# Patient Record
Sex: Male | Born: 2005 | Hispanic: No | Marital: Single | Smoking: Never smoker
Health system: Southern US, Community
[De-identification: ages and names within clinical notes are randomized; demographics above are authoritative.]

## PROBLEM LIST (undated history)

## (undated) DIAGNOSIS — Q872 Congenital malformation syndromes predominantly involving limbs: Secondary | ICD-10-CM

## (undated) DIAGNOSIS — Q600 Renal agenesis, unilateral: Secondary | ICD-10-CM

## (undated) DIAGNOSIS — Q249 Congenital malformation of heart, unspecified: Secondary | ICD-10-CM

## (undated) DIAGNOSIS — IMO0002 Reserved for concepts with insufficient information to code with codable children: Secondary | ICD-10-CM

## (undated) DIAGNOSIS — K5900 Constipation, unspecified: Secondary | ICD-10-CM

## (undated) DIAGNOSIS — Q8789 Other specified congenital malformation syndromes, not elsewhere classified: Secondary | ICD-10-CM

## (undated) HISTORY — PX: COLON SURGERY: SHX602

## (undated) HISTORY — DX: Other specified congenital malformation syndromes, not elsewhere classified: Q87.89

## (undated) HISTORY — DX: Constipation, unspecified: K59.00

## (undated) HISTORY — DX: Congenital malformation syndromes predominantly involving limbs: Q87.2

## (undated) HISTORY — DX: Reserved for concepts with insufficient information to code with codable children: IMO0002

## (undated) HISTORY — DX: Renal agenesis, unilateral: Q60.0

## (undated) HISTORY — DX: Congenital malformation of heart, unspecified: Q24.9

---
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
## 2023-05-04 ENCOUNTER — Ambulatory Visit
Admission: RE | Admit: 2023-05-04 | Discharge: 2023-05-04 | Disposition: A | Discharge status: Home / Self Care | Payer: Medicaid Other | Source: Ambulatory Visit | Attending: Emergency Medicine | Admitting: Emergency Medicine

## 2023-05-04 ENCOUNTER — Ambulatory Visit: Payer: Self-pay

## 2023-05-04 VITALS — BP 127/86 | HR 106 | Temp 99.6°F | Resp 16 | Ht 71.0 in | Wt 300.0 lb

## 2023-05-04 DIAGNOSIS — J069 Acute upper respiratory infection, unspecified: Secondary | ICD-10-CM

## 2023-05-04 LAB — RESP PANEL BY RT-PCR (FLU A&B, COVID) ARPGX2
Influenza A by PCR: NEGATIVE
Influenza B by PCR: NEGATIVE
SARS Coronavirus 2 by RT PCR: NEGATIVE

## 2023-05-04 NOTE — ED Provider Notes (Signed)
[facility]    CSN: [id number] Arrival date & time: 05/04/23  1339      History   Chief Complaint Chief Complaint  Patient presents with   Cough    HPI Russell Vang is a 17 y.o. male.   17 year old male pt, Russell Vang, presents to urgent care for evaluation of cough x 1 day.  The history is provided by the patient. No language interpreter was used.    Past Medical History:  Diagnosis Date   Constipation    Solitary kidney    VACTERL association     Patient Active Problem List   Diagnosis Date Noted   Viral URI with cough 05/04/2023    Past Surgical History:  Procedure Laterality Date   COLON SURGERY         Home Medications    Prior to Admission medications   Medication Sig Start Date End Date Taking? Authorizing Provider  ibuprofen (ADVIL) 800 MG tablet Take 1 tablet (800 mg total) by mouth 3 (three) times daily. Take with food. 04/13/19   Bailey Mech, NP  oseltamivir (TAMIFLU) 75 MG capsule Take 1 capsule (75 mg total) by mouth every 12 (twelve) hours. 06/02/22   White, Elita Boone, NP  predniSONE (DELTASONE) 10 MG tablet 50 mg daily x 3 days, then 40 mg daily x 3 days, then 30 mg daily x 3 days, then 20 mg daily x 3 days, then 10 mg daily x 3 days. 12/16/17   Tommie Sams, DO  sodium chloride irrigation 0.9 % irrigation IRRIGATE WITH 1500 ML EVERY OTHER DAY 11/12/14   [provider]    Family History Family History  Problem Relation Age of Onset   Healthy Mother    Healthy Father     Social History Social History   Tobacco Use   Smoking status: Never   Smokeless tobacco: Never  Vaping Use   Vaping status: Never Used  Substance Use Topics   Alcohol use: No    Alcohol/week: 0.0 standard drinks of alcohol   Drug use: No     Allergies   Patient has no known allergies.   Review of Systems Review of Systems   Physical Exam Triage Vital Signs ED Triage Vitals [05/04/23 1345]  Encounter Vitals Group      BP      Systolic BP Percentile      Diastolic BP Percentile      Pulse      Resp 16     Temp      Temp Source Oral     SpO2      Weight      Height      Head Circumference      Peak Flow      Pain Score      Pain Loc      Pain Education      Exclude from Growth Chart    No data found.  Updated Vital Signs BP 127/86 (BP Location: Right Arm)   Pulse (!) 106   Temp 99.6 F (37.6 C) (Oral)   Resp 16   Ht 5\' 11"  (1.803 m)   Wt 300 lb (136.1 kg)   SpO2 96%   BMI 41.84 kg/m   Visual Acuity Right Eye Distance:   Left Eye Distance:   Bilateral Distance:    Right Eye Near:   Left Eye Near:    Bilateral Near:     Physical Exam   UC Treatments /  Results  Labs (all labs ordered are listed, but only abnormal results are displayed) Labs Reviewed  RESP PANEL BY RT-PCR (FLU A&B, COVID) ARPGX2    EKG   Radiology No results found.  Procedures Procedures (including critical care time)  Medications Ordered in UC Medications - No data to display  Initial Impression / Assessment and Plan / UC Course  I have reviewed the triage vital signs and the nursing notes.  Pertinent labs & imaging results that were available during my care of the patient were reviewed by me and considered in my medical decision making (see chart for details).    Discussed exam findings and plan of care with patient, strict go to ER precautions given.   Patient verbalized understanding to this provider. Ddx: Viral uri w cough Final Clinical Impressions(s) / UC Diagnoses   Final diagnoses:  Viral URI with cough     Discharge Instructions      Check my chart for results. Most likely you have a viral illness: no antibiotic as indicated at this time, May treat with OTC meds of choice. Make sure to drink plenty of fluids to stay hydrated(gatorade, water, popsicles,jello,etc), avoid caffeine products. Follow up with PCP. Return as needed.     ED Prescriptions   None    PDMP not  reviewed this encounter.   Clancy Gourd, NP 05/04/23 1450

## 2023-05-04 NOTE — ED Provider Notes (Incomplete)
[facility]    CSN: [id number] Arrival date & time: 05/04/23  1339      History   Chief Complaint Chief Complaint  Patient presents with  . Cough    HPI [name] is a 17 y.o. male.   17 year old male pt, [name], presents to urgent care for evaluation of cough x 1 day.  Patient requesting flu and COVID test.  Patient denies any known illness contacts, does not work, attends school.  The history is provided by the patient. No language interpreter was used.    Past Medical History:  Diagnosis Date  . Constipation   . Solitary kidney   . VACTERL association     Patient Active Problem List   Diagnosis Date Noted  . Viral URI with cough 05/04/2023    Past Surgical History:  Procedure Laterality Date  . COLON SURGERY         Home Medications    Prior to Admission medications   Medication Sig Start Date End Date Taking? Authorizing Provider  ibuprofen (ADVIL) 800 MG tablet Take 1 tablet (800 mg total) by mouth 3 (three) times daily. Take with food. 04/13/19   Bailey Mech, NP  oseltamivir (TAMIFLU) 75 MG capsule Take 1 capsule (75 mg total) by mouth every 12 (twelve) hours. 06/02/22   White, Elita Boone, NP  predniSONE (DELTASONE) 10 MG tablet 50 mg daily x 3 days, then 40 mg daily x 3 days, then 30 mg daily x 3 days, then 20 mg daily x 3 days, then 10 mg daily x 3 days. 12/16/17   Tommie Sams, DO  sodium chloride irrigation 0.9 % irrigation IRRIGATE WITH 1500 ML EVERY OTHER DAY 11/12/14   [provider]    Family History Family History  Problem Relation Age of Onset  . Healthy Mother   . Healthy Father     Social History Social History   Tobacco Use  . Smoking status: Never  . Smokeless tobacco: Never  Vaping Use  . Vaping status: Never Used  Substance Use Topics  . Alcohol use: No    Alcohol/week: 0.0 standard drinks of alcohol  . Drug use: No     Allergies   Patient has no known allergies.   Review of  Systems Review of Systems  Constitutional:  Negative for fever.  HENT:  Positive for congestion.   Respiratory:  Positive for cough. Negative for shortness of breath and wheezing.   All other systems reviewed and are negative.    Physical Exam Triage Vital Signs ED Triage Vitals [05/04/23 1345]  Encounter Vitals Group     BP      Systolic BP Percentile      Diastolic BP Percentile      Pulse      Resp 16     Temp      Temp Source Oral     SpO2      Weight      Height      Head Circumference      Peak Flow      Pain Score      Pain Loc      Pain Education      Exclude from Growth Chart    No data found.  Updated Vital Signs BP 127/86 (BP Location: Right Arm)   Pulse (!) 106   Temp 99.6 F (37.6 C) (Oral)   Resp 16   Ht 5\' 11"  (1.803 m)   Wt  300 lb (136.1 kg)   SpO2 96%   BMI 41.84 kg/m   Visual Acuity Right Eye Distance:   Left Eye Distance:   Bilateral Distance:    Right Eye Near:   Left Eye Near:    Bilateral Near:     Physical Exam Vitals and nursing note reviewed.  Constitutional:      Appearance: Normal appearance. He is well-developed and well-groomed.  HENT:     Head: Normocephalic.     Right Ear: Tympanic membrane is retracted.     Left Ear: Tympanic membrane is retracted.     Nose: Congestion present.     Mouth/Throat:     Lips: Pink.     Mouth: Mucous membranes are moist.     Pharynx: Oropharynx is clear. Uvula midline.  Cardiovascular:     Rate and Rhythm: Regular rhythm. Tachycardia present.     Pulses: Normal pulses.     Heart sounds: Normal heart sounds.  Pulmonary:     Effort: Pulmonary effort is normal.     Breath sounds: Normal breath sounds and air entry.  Neurological:     General: No focal deficit present.     Mental Status: He is alert and oriented to person, place, and time.     GCS: GCS eye subscore is 4. GCS verbal subscore is 5. GCS motor subscore is 6.     Cranial Nerves: No cranial nerve deficit.     Sensory: No  sensory deficit.  Psychiatric:        Attention and Perception: Attention normal.        Mood and Affect: Mood normal.        Speech: Speech normal.        Behavior: Behavior normal. Behavior is cooperative.      UC Treatments / Results  Labs (all labs ordered are listed, but only abnormal results are displayed) Labs Reviewed  RESP PANEL BY RT-PCR (FLU A&B, COVID) ARPGX2    EKG   Radiology No results found.  Procedures Procedures (including critical care time)  Medications Ordered in UC Medications - No data to display  Initial Impression / Assessment and Plan / UC Course  I have reviewed the triage vital signs and the nursing notes.  Pertinent labs & imaging results that were available during my care of the patient were reviewed by me and considered in my medical decision making (see chart for details).     *** Final Clinical Impressions(s) / UC Diagnoses   Final diagnoses:  Viral URI with cough     Discharge Instructions      Check my chart for results. Most likely you have a viral illness: no antibiotic as indicated at this time, May treat with OTC meds of choice. Make sure to drink plenty of fluids to stay hydrated(gatorade, water, popsicles,jello,etc), avoid caffeine products. Follow up with PCP. Return as needed.     ED Prescriptions   None    PDMP not reviewed this encounter.

## 2023-05-04 NOTE — Discharge Instructions (Signed)
Check my chart for results. Most likely you have a viral illness: no antibiotic as indicated at this time, May treat with OTC meds of choice. Make sure to drink plenty of fluids to stay hydrated(gatorade, water, popsicles,jello,etc), avoid caffeine products. Follow up with PCP. Return as needed.

## 2023-05-04 NOTE — ED Triage Notes (Signed)
Pt c/o cough x 1 day

## 2023-05-09 ENCOUNTER — Ambulatory Visit
Admission: RE | Admit: 2023-05-09 | Discharge: 2023-05-09 | Disposition: A | Discharge status: Home / Self Care | Payer: Medicaid Other | Source: Ambulatory Visit | Attending: Emergency Medicine | Admitting: Emergency Medicine

## 2023-05-09 VITALS — BP 136/95 | HR 129 | Temp 99.4°F | Resp 18

## 2023-05-09 DIAGNOSIS — R051 Acute cough: Secondary | ICD-10-CM

## 2023-05-09 DIAGNOSIS — J988 Other specified respiratory disorders: Secondary | ICD-10-CM | POA: Diagnosis not present

## 2023-05-09 MED ORDER — DOXYCYCLINE HYCLATE 100 MG PO CAPS: 100.0000 mg | ORAL_CAPSULE | Freq: Two times a day (BID) | ORAL | 0 refills | Status: AC

## 2023-05-09 NOTE — Discharge Instructions (Signed)
We are treating you with doxycycline for respiratory infection. Take as directed. Push fluids(water or Gatorade Jell-O popsicles etc). Follow up with PCP.  If you develop chest pain, shortness of breath, unable to keep antibiotic down, vomiting, or worsening symptoms go immediately to the emergency room for further evaluation.

## 2023-05-09 NOTE — ED Triage Notes (Signed)
Pt presents with a cough x 1 week. Pt was seen 11/14 and is not better. Pt states he continues to have a fever off and on.

## 2023-05-09 NOTE — ED Provider Notes (Signed)
[facility]    CSN: [id number] Arrival date & time: 05/09/23  0945      History   Chief Complaint Chief Complaint  Patient presents with   Cough    Been almost 2 weeks with cough, chills, body aches, no appetite, no energy, severe junky chest - Entered by patient   Fever    HPI Russell Vang is a 17 y.o. male.   17 year old male pt, Ha Couzens, presents to urgent care for evaluation of worsening cough x 6 days.  Patient was seen 5 days prior had a negative workup is not improving, intermittent fever.  Patient states has been taking Mucinex for symptoms without relief, decreased solid appetite, drinking plenty of fluids per patient report.  The history is provided by the patient and a relative. No language interpreter was used.    Past Medical History:  Diagnosis Date   Constipation    Solitary kidney    VACTERL association     Patient Active Problem List   Diagnosis Date Noted   Respiratory infection 05/09/2023   Acute cough 05/09/2023   Viral URI with cough 05/04/2023    Past Surgical History:  Procedure Laterality Date   COLON SURGERY         Home Medications    Prior to Admission medications   Medication Sig Start Date End Date Taking? Authorizing Provider  doxycycline (VIBRAMYCIN) 100 MG capsule Take 1 capsule (100 mg total) by mouth 2 (two) times daily. 05/09/23  Yes Geo Slone, Para March, NP  ibuprofen (ADVIL) 800 MG tablet Take 1 tablet (800 mg total) by mouth 3 (three) times daily. Take with food. 04/13/19   Bailey Mech, NP  oseltamivir (TAMIFLU) 75 MG capsule Take 1 capsule (75 mg total) by mouth every 12 (twelve) hours. 06/02/22   White, Elita Boone, NP  predniSONE (DELTASONE) 10 MG tablet 50 mg daily x 3 days, then 40 mg daily x 3 days, then 30 mg daily x 3 days, then 20 mg daily x 3 days, then 10 mg daily x 3 days. 12/16/17   Tommie Sams, DO  sodium chloride irrigation 0.9 % irrigation IRRIGATE WITH 1500 ML EVERY OTHER DAY 11/12/14    [provider]    Family History Family History  Problem Relation Age of Onset   Healthy Mother    Healthy Father     Social History Social History   Tobacco Use   Smoking status: Never   Smokeless tobacco: Never  Vaping Use   Vaping status: Never Used  Substance Use Topics   Alcohol use: No    Alcohol/week: 0.0 standard drinks of alcohol   Drug use: No     Allergies   Patient has no known allergies.   Review of Systems Review of Systems  Constitutional:  Positive for fever.  Respiratory:  Positive for cough. Negative for shortness of breath and wheezing.   Cardiovascular:  Negative for chest pain and palpitations.  All other systems reviewed and are negative.    Physical Exam Triage Vital Signs ED Triage Vitals  Encounter Vitals Group     BP 05/09/23 1014 (!) 136/95     Systolic BP Percentile --      Diastolic BP Percentile --      Pulse Rate 05/09/23 1014 (!) 129     Resp 05/09/23 1014 18     Temp 05/09/23 1014 99.4 F (37.4 C)     Temp Source 05/09/23 1014 Oral  SpO2 05/09/23 1014 92 %     Weight --      Height --      Head Circumference --      Peak Flow --      Pain Score 05/09/23 1013 0     Pain Loc --      Pain Education --      Exclude from Growth Chart --    No data found.  Updated Vital Signs BP (!) 136/95 (BP Location: Left Arm)   Pulse (!) 129   Temp 99.4 F (37.4 C) (Oral)   Resp 18   SpO2 92%   Visual Acuity Right Eye Distance:   Left Eye Distance:   Bilateral Distance:    Right Eye Near:   Left Eye Near:    Bilateral Near:     Physical Exam Vitals and nursing note reviewed.  Constitutional:      General: He is not in acute distress.    Appearance: He is well-developed and overweight.  HENT:     Head: Normocephalic and atraumatic.     Right Ear: Tympanic membrane is retracted.     Left Ear: Tympanic membrane is retracted.     Nose: Mucosal edema and congestion present.     Right Sinus: Maxillary sinus  tenderness present.     Left Sinus: Maxillary sinus tenderness present.     Mouth/Throat:     Lips: Pink.     Mouth: Mucous membranes are moist.     Pharynx: Oropharynx is clear. Uvula midline.     Tonsils: No tonsillar exudate or tonsillar abscesses.  Eyes:     Conjunctiva/sclera: Conjunctivae normal.  Cardiovascular:     Rate and Rhythm: Regular rhythm. Tachycardia present.     Pulses: Normal pulses.     Heart sounds: Normal heart sounds. No murmur heard. Pulmonary:     Effort: Pulmonary effort is normal. No respiratory distress.     Breath sounds: Normal air entry.     Comments: Coarse breath sounds throughout, O2 sat 92% on room air, respirations 18 and unlabored Abdominal:     Palpations: Abdomen is soft.     Tenderness: There is no abdominal tenderness.  Musculoskeletal:        General: No swelling.     Cervical back: Neck supple.  Skin:    General: Skin is warm and dry.     Capillary Refill: Capillary refill takes less than 2 seconds.  Neurological:     General: No focal deficit present.     Mental Status: He is alert and oriented to person, place, and time.     GCS: GCS eye subscore is 4. GCS verbal subscore is 5. GCS motor subscore is 6.     Cranial Nerves: No cranial nerve deficit.     Sensory: No sensory deficit.  Psychiatric:        Attention and Perception: Attention normal.        Mood and Affect: Mood normal.        Speech: Speech normal.        Behavior: Behavior normal. Behavior is cooperative.      UC Treatments / Results  Labs (all labs ordered are listed, but only abnormal results are displayed) Labs Reviewed - No data to display  EKG   Radiology No results found.  Procedures Procedures (including critical care time)  Medications Ordered in UC Medications - No data to display  Initial Impression / Assessment and Plan / UC Course  I have reviewed the triage vital signs and the nursing notes.  Pertinent labs & imaging results that were  available during my care of the patient were reviewed by me and considered in my medical decision making (see chart for details).    Discussed exam findings and plan of care with patient, treating with doxycycline, strict go to ER precautions given.   Patient verbalized understanding to this provider.  Ddx: Respiratory infection, cough, viral illness, allergies Final Clinical Impressions(s) / UC Diagnoses   Final diagnoses:  Respiratory infection  Acute cough     Discharge Instructions      We are treating you with doxycycline for respiratory infection. Take as directed. Push fluids(water or Gatorade Jell-O popsicles etc). Follow up with PCP.  If you develop chest pain, shortness of breath, unable to keep antibiotic down, vomiting, or worsening symptoms go immediately to the emergency room for further evaluation.    ED Prescriptions     Medication Sig Dispense Auth. Provider   doxycycline (VIBRAMYCIN) 100 MG capsule Take 1 capsule (100 mg total) by mouth 2 (two) times daily. 20 capsule Whitlee Sluder, Para March, NP      PDMP not reviewed this encounter.   Clancy Gourd, NP 05/09/23 [phone]
